# Patient Record
Sex: Male | Born: 1995 | Race: White | Hispanic: No | Marital: Single | Smoking: Never smoker
Health system: Southern US, Community
[De-identification: ages and names within clinical notes are randomized; demographics above are authoritative.]

## PROBLEM LIST (undated history)

## (undated) DIAGNOSIS — J45909 Unspecified asthma, uncomplicated: Secondary | ICD-10-CM

## (undated) DIAGNOSIS — G43909 Migraine, unspecified, not intractable, without status migrainosus: Secondary | ICD-10-CM

---

## 2019-01-17 ENCOUNTER — Other Ambulatory Visit (HOSPITAL_COMMUNITY): Payer: Self-pay | Admitting: Gastroenterology

## 2019-01-17 ENCOUNTER — Other Ambulatory Visit: Payer: Self-pay | Admitting: Gastroenterology

## 2019-01-17 DIAGNOSIS — R1011 Right upper quadrant pain: Secondary | ICD-10-CM

## 2019-02-04 ENCOUNTER — Encounter (HOSPITAL_COMMUNITY): Admission: RE | Admit: 2019-02-04 | Payer: Commercial Managed Care - PPO | Source: Ambulatory Visit

## 2019-02-04 ENCOUNTER — Ambulatory Visit (HOSPITAL_COMMUNITY): Payer: Commercial Managed Care - PPO | Attending: Gastroenterology

## 2019-02-04 ENCOUNTER — Encounter (HOSPITAL_COMMUNITY): Payer: Self-pay

## 2019-02-17 ENCOUNTER — Other Ambulatory Visit: Payer: Self-pay | Admitting: Gastroenterology

## 2019-02-17 DIAGNOSIS — R634 Abnormal weight loss: Secondary | ICD-10-CM

## 2019-02-17 DIAGNOSIS — R1013 Epigastric pain: Secondary | ICD-10-CM

## 2019-03-07 ENCOUNTER — Other Ambulatory Visit: Payer: Self-pay | Admitting: Gastroenterology

## 2019-03-07 DIAGNOSIS — R110 Nausea: Secondary | ICD-10-CM

## 2019-03-07 DIAGNOSIS — R634 Abnormal weight loss: Secondary | ICD-10-CM

## 2019-03-07 DIAGNOSIS — R1013 Epigastric pain: Secondary | ICD-10-CM

## 2019-03-16 ENCOUNTER — Ambulatory Visit
Admission: RE | Admit: 2019-03-16 | Discharge: 2019-03-16 | Disposition: A | Discharge status: Home / Self Care | Payer: Commercial Managed Care - PPO | Source: Ambulatory Visit | Attending: Gastroenterology | Admitting: Gastroenterology

## 2019-03-16 ENCOUNTER — Other Ambulatory Visit: Payer: Self-pay

## 2019-03-16 DIAGNOSIS — R1013 Epigastric pain: Secondary | ICD-10-CM

## 2019-03-16 DIAGNOSIS — R110 Nausea: Secondary | ICD-10-CM

## 2019-03-16 DIAGNOSIS — R634 Abnormal weight loss: Secondary | ICD-10-CM

## 2019-03-16 MED ORDER — IOPAMIDOL (ISOVUE-300) INJECTION 61%
100.0000 mL | Freq: Once | INTRAVENOUS | Status: AC | PRN
  Administered 2019-03-16: 100 mL via INTRAVENOUS

## 2019-04-06 ENCOUNTER — Ambulatory Visit: Payer: Commercial Managed Care - PPO | Admitting: Neurology

## 2019-04-06 ENCOUNTER — Telehealth: Payer: Self-pay | Admitting: *Deleted

## 2019-04-06 NOTE — Telephone Encounter (Signed)
No showed new patient appointment. 

## 2019-10-13 ENCOUNTER — Ambulatory Visit (HOSPITAL_COMMUNITY)
Admission: EM | Admit: 2019-10-13 | Discharge: 2019-10-13 | Disposition: A | Discharge status: Home / Self Care | Payer: Commercial Managed Care - PPO | Attending: Family Medicine | Admitting: Family Medicine

## 2019-10-13 ENCOUNTER — Other Ambulatory Visit: Payer: Self-pay

## 2019-10-13 ENCOUNTER — Encounter (HOSPITAL_COMMUNITY): Payer: Self-pay

## 2019-10-13 DIAGNOSIS — G43919 Migraine, unspecified, intractable, without status migrainosus: Secondary | ICD-10-CM

## 2019-10-13 MED ORDER — KETOROLAC TROMETHAMINE 30 MG/ML IJ SOLN
INTRAMUSCULAR | Status: AC
  Filled 2019-10-13: qty 1

## 2019-10-13 MED ORDER — DICLOFENAC SODIUM 75 MG PO TBEC: 75.0000 mg | DELAYED_RELEASE_TABLET | Freq: Two times a day (BID) | ORAL | 0 refills | Status: AC | PRN

## 2019-10-13 MED ORDER — KETOROLAC TROMETHAMINE 30 MG/ML IJ SOLN
30.0000 mg | Freq: Once | INTRAMUSCULAR | Status: AC
  Administered 2019-10-13: 30 mg via INTRAMUSCULAR

## 2019-10-13 NOTE — Discharge Instructions (Signed)
You were given a shot of Toradol 30mg  today to help with your headache. In 3 to 4 hours, if the pain persists, you may also take Voltaren 75mg  every 12 hours as needed. Rest. If pain does not improve within 48 hours, return for recheck or go to the ER if pain worsens or any increased dizziness, nausea with vomiting, or vision changes occur.

## 2019-10-13 NOTE — ED Triage Notes (Signed)
Pt state he has a migriane for 3 hours.

## 2019-10-14 NOTE — ED Provider Notes (Signed)
[facility]    CSN: [id number] Arrival date & time: 10/13/19  1935      History   Chief Complaint Chief Complaint  Patient presents with  . Migraine    HPI Warren Garrett is a 24 y.o. male.   24 year old male presents with a headache that started about 3 hours ago. Pain is mainly in the frontal region and top of his head. It is pounding. He also is experiencing some nausea and photophobia but denies any vomiting, fever, vision changes, numbness or weakness. He has had some nasal congestion with the headache but no sore throat, cough or chest pain. He has been diagnosed with migraine headaches in the past and this is similar to previous headaches except it has been more intense. He also has a history of previous concussion and had imaging 2 years ago which was "normal" per patient. He has not seen a Neurologist yet. He has taken Ibuprofen 800mg  with some relief. No other chronic health issues except mild GERD and takes a daily medication (uncertain of name) for management.   The history is provided by the patient.    History reviewed. No pertinent past medical history.  There are no problems to display for this patient.   History reviewed. No pertinent surgical history.     Home Medications    Prior to Admission medications   Medication Sig Start Date End Date Taking? Authorizing Provider  diclofenac (VOLTAREN) 75 MG EC tablet Take 1 tablet (75 mg total) by mouth every 12 (twelve) hours as needed for moderate pain. 10/13/19   12/13/19, NP    Family History History reviewed. No pertinent family history.  Social History Social History   Tobacco Use  . Smoking status: Never Smoker  . Smokeless tobacco: Never Used  Substance Use Topics  . Alcohol use: Yes  . Drug use: Yes    Types: Marijuana     Allergies   Patient has no known allergies.   Review of Systems Review of Systems  Constitutional: Negative for activity change, appetite change,  chills, diaphoresis and fever.  HENT: Positive for congestion. Negative for facial swelling, nosebleeds, rhinorrhea, sinus pressure, sinus pain, sore throat, tinnitus and trouble swallowing.   Eyes: Positive for photophobia. Negative for pain, discharge and redness.  Respiratory: Negative for cough, chest tightness and shortness of breath.   Cardiovascular: Negative for chest pain.  Gastrointestinal: Positive for nausea. Negative for vomiting.  Musculoskeletal: Negative for neck pain and neck stiffness.  Skin: Negative for rash and wound.  Allergic/Immunologic: Negative for environmental allergies, food allergies and immunocompromised state.  Neurological: Positive for light-headedness and headaches. Negative for dizziness, tremors, seizures, syncope, facial asymmetry, speech difficulty, weakness and numbness.  Hematological: Negative for adenopathy. Does not bruise/bleed easily.     Physical Exam Triage Vital Signs ED Triage Vitals  Enc Vitals Group     BP 10/13/19 1949 116/68     Pulse Rate 10/13/19 1949 67     Resp 10/13/19 1949 16     Temp 10/13/19 1949 98.3 F (36.8 C)     Temp Source 10/13/19 1949 Oral     SpO2 10/13/19 1949 100 %     Weight 10/13/19 1947 160 lb (72.6 kg)     Height --      Head Circumference --      Peak Flow --      Pain Score 10/13/19 1947 4     Pain Loc --  Pain Edu? --      Excl. in GC? --    No data found.  Updated Vital Signs BP 116/68 (BP Location: Right Arm)   Pulse 67   Temp 98.3 F (36.8 C) (Oral)   Resp 16   Wt 160 lb (72.6 kg)   SpO2 100%   Visual Acuity Right Eye Distance:   Left Eye Distance:   Bilateral Distance:    Right Eye Near:   Left Eye Near:    Bilateral Near:     Physical Exam Vitals and nursing note reviewed.  Constitutional:      General: He is awake. He is not in acute distress.    Appearance: He is well-developed and well-groomed. He is not ill-appearing.     Comments: Patient sitting comfortably on exam  table in no acute distress.   HENT:     Head: Normocephalic and atraumatic.      Comments: Pain at Parietal region of head but no tenderness.     Right Ear: Hearing, tympanic membrane and external ear normal. There is impacted cerumen.     Left Ear: Hearing, tympanic membrane and external ear normal. There is impacted cerumen.     Ears:     Comments: Cerumen present in both ear canals but not completely occluding canal.     Nose: Nose normal. No rhinorrhea.     Right Sinus: No maxillary sinus tenderness or frontal sinus tenderness.     Left Sinus: No maxillary sinus tenderness or frontal sinus tenderness.     Mouth/Throat:     Lips: Pink.     Mouth: Mucous membranes are moist.     Pharynx: Oropharynx is clear. Uvula midline. No pharyngeal swelling, oropharyngeal exudate, posterior oropharyngeal erythema or uvula swelling.  Eyes:     Extraocular Movements: Extraocular movements intact.     Conjunctiva/sclera: Conjunctivae normal.     Pupils: Pupils are equal, round, and reactive to light.  Neck:     Vascular: No carotid bruit.  Cardiovascular:     Rate and Rhythm: Normal rate and regular rhythm.     Pulses: Normal pulses.     Heart sounds: Normal heart sounds. No murmur.  Pulmonary:     Effort: Pulmonary effort is normal. No respiratory distress.     Breath sounds: Normal breath sounds and air entry. No decreased breath sounds, wheezing or rhonchi.  Musculoskeletal:        General: Normal range of motion.     Cervical back: Normal range of motion and neck supple. No rigidity or tenderness.  Lymphadenopathy:     Cervical: No cervical adenopathy.  Skin:    General: Skin is warm and dry.     Capillary Refill: Capillary refill takes less than 2 seconds.     Findings: No rash.  Neurological:     General: No focal deficit present.     Mental Status: He is alert and oriented to person, place, and time.     Cranial Nerves: Cranial nerves are intact.     Sensory: Sensation is intact.  No sensory deficit.     Motor: Motor function is intact.     Coordination: Coordination is intact.     Gait: Gait is intact.     Deep Tendon Reflexes: Reflexes are normal and symmetric.  Psychiatric:        Mood and Affect: Mood normal.        Behavior: Behavior normal. Behavior is cooperative.  Thought Content: Thought content normal.        Judgment: Judgment normal.      UC Treatments / Results  Labs (all labs ordered are listed, but only abnormal results are displayed) Labs Reviewed - No data to display  EKG   Radiology No results found.  Procedures Procedures (including critical care time)  Medications Ordered in UC Medications  ketorolac (TORADOL) 30 MG/ML injection 30 mg (30 mg Intramuscular Given 10/13/19 2016)    Initial Impression / Assessment and Plan / UC Course  I have reviewed the triage vital signs and the nursing notes.  Pertinent labs & imaging results that were available during my care of the patient were reviewed by me and considered in my medical decision making (see chart for details).    Reviewed with patient that he appears to be having another migraine headache- no concerning neurological findings. Do not believe any imaging is indicated at this time. Discussed that the intensity of the headache may be due to various factors including change in barometric pressure, stress, sleep pattern, food pattern or unknown. Continue to monitor symptoms and pain level. Gave Toradol 30mg  IM now to help with pain. Rest. May take Voltaren 75mg  every 12 hours as needed for headache- may take 4 hours after Toradol if headache has not resolved. Continue to stay well hydrated. If pain/headache does not improve within 48 hours, return here for recheck or go to the ER if pain worsens or any increased dizziness, weakness, nausea with vomiting or vision changes occurs.   Final Clinical Impressions(s) / UC Diagnoses   Final diagnoses:  Intractable migraine without status  migrainosus, unspecified migraine type     Discharge Instructions     You were given a shot of Toradol 30mg  today to help with your headache. In 3 to 4 hours, if the pain persists, you may also take Voltaren 75mg  every 12 hours as needed. Rest. If pain does not improve within 48 hours, return for recheck or go to the ER if pain worsens or any increased dizziness, nausea with vomiting, or vision changes occur.     ED Prescriptions    Medication Sig Dispense Auth. Provider   diclofenac (VOLTAREN) 75 MG EC tablet Take 1 tablet (75 mg total) by mouth every 12 (twelve) hours as needed for moderate pain. 30 tablet Amyot, Nicholes Stairs, NP     PDMP not reviewed this encounter.   Katy Apo, NP 10/14/19 [phone]

## 2019-12-15 ENCOUNTER — Other Ambulatory Visit: Payer: Self-pay | Admitting: Gastroenterology

## 2019-12-15 DIAGNOSIS — R1013 Epigastric pain: Secondary | ICD-10-CM

## 2019-12-23 ENCOUNTER — Ambulatory Visit
Admission: RE | Admit: 2019-12-23 | Discharge: 2019-12-23 | Disposition: A | Discharge status: Home / Self Care | Payer: Commercial Managed Care - PPO | Source: Ambulatory Visit | Attending: Gastroenterology | Admitting: Gastroenterology

## 2019-12-23 DIAGNOSIS — R1013 Epigastric pain: Secondary | ICD-10-CM

## 2020-04-04 ENCOUNTER — Ambulatory Visit (HOSPITAL_COMMUNITY): Payer: Self-pay

## 2020-06-12 IMAGING — CT CT ABDOMEN AND PELVIS WITH CONTRAST
1 of 2 series · 14 of 32 positions shown, 19 images · IV contrast (APPLIED)
Comparison: None

CLINICAL DATA: Epigastric pain, anorexia, and weight loss over past
9 months.

EXAM:
CT ABDOMEN AND PELVIS WITH CONTRAST
TECHNIQUE: Multidetector CT imaging of the abdomen and pelvis was performed
using the standard protocol following bolus administration of
intravenous contrast.
CONTRAST:  100mL [id number] IOPAMIDOL ([id number]) INJECTION 61%

[Series 2: abd/pelvis w/cm · axial · 0.67mm/px · z∈[-488,-93]mm · 14 of 89 slices shown, 19 images]
[im 5/89  soft-tissue]
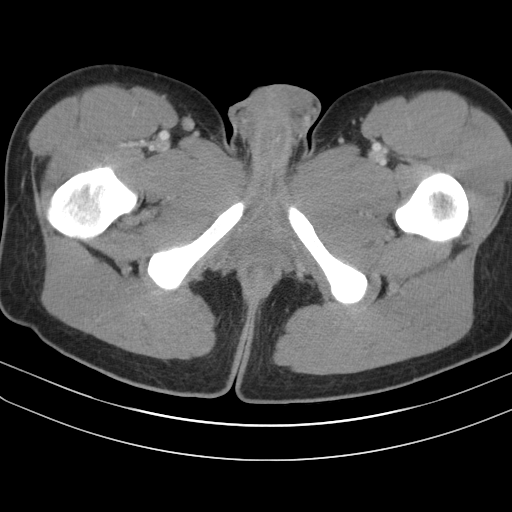
[im 5/89  bone]
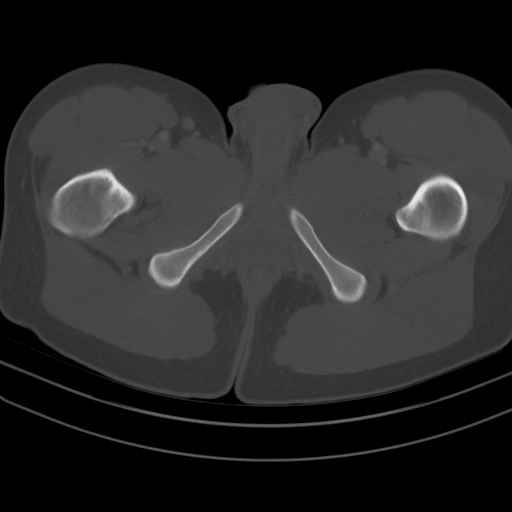
[im 10/89  soft-tissue]
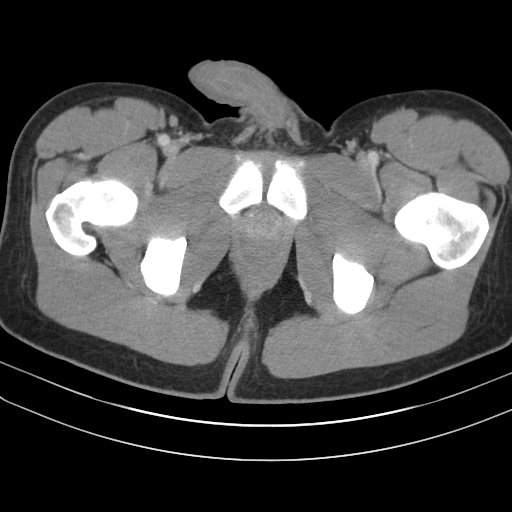
[im 20/89  soft-tissue]
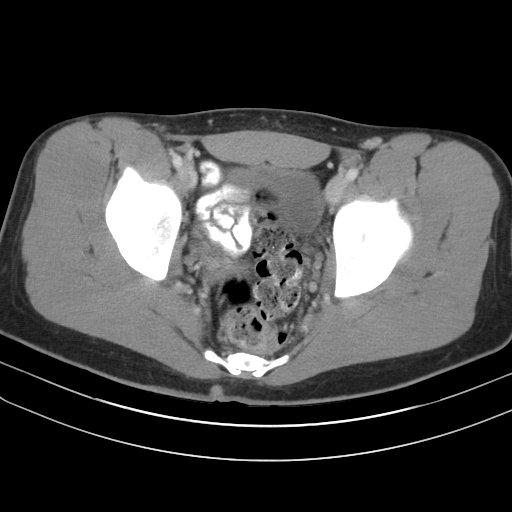
[im 25/89  soft-tissue]
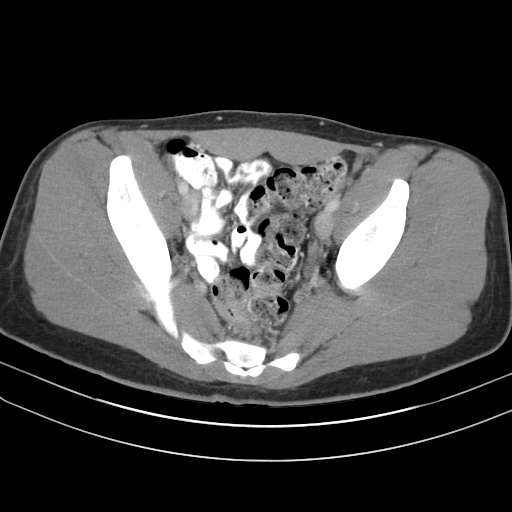
[im 30/89  soft-tissue]
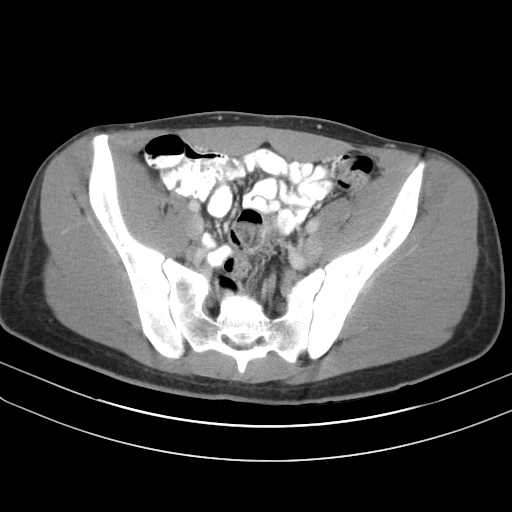
[im 40/89  soft-tissue]
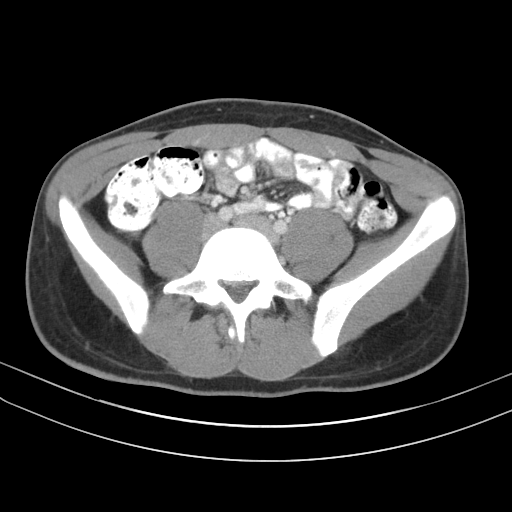
[im 45/89  soft-tissue]
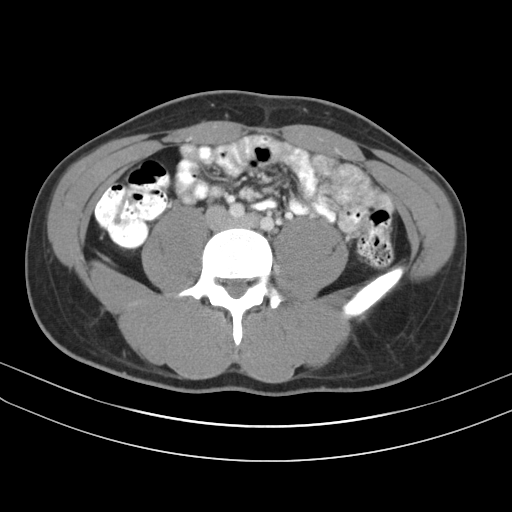
[im 49/89  soft-tissue]
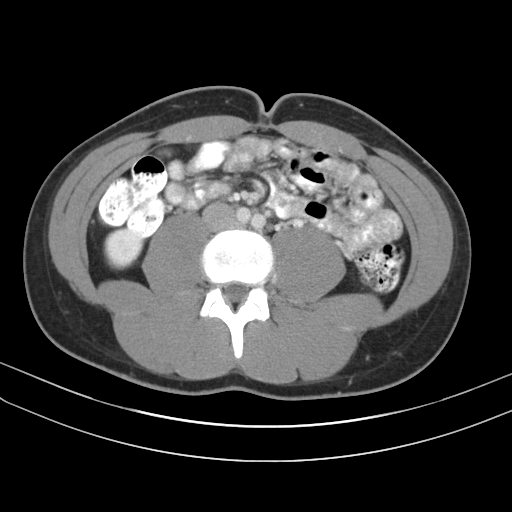
[im 59/89  soft-tissue]
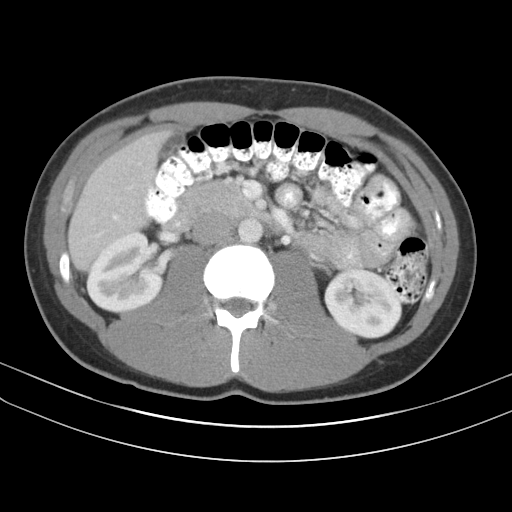
[im 59/89  bone]
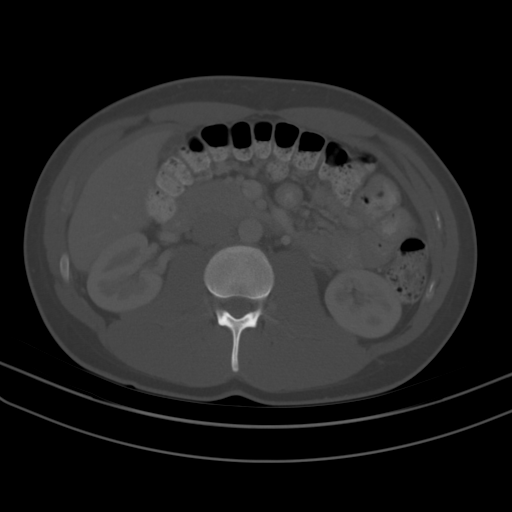
[im 64/89  soft-tissue]
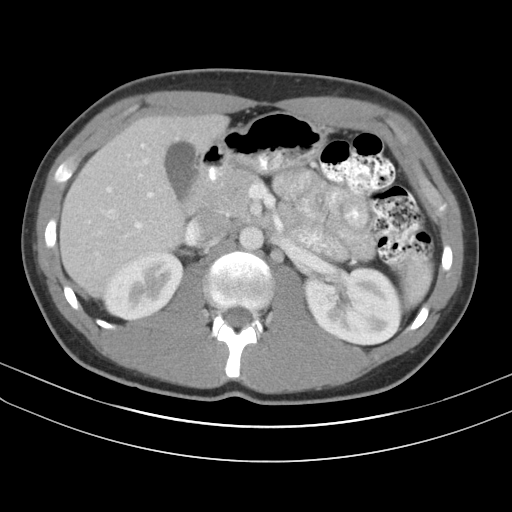
[im 69/89  soft-tissue]
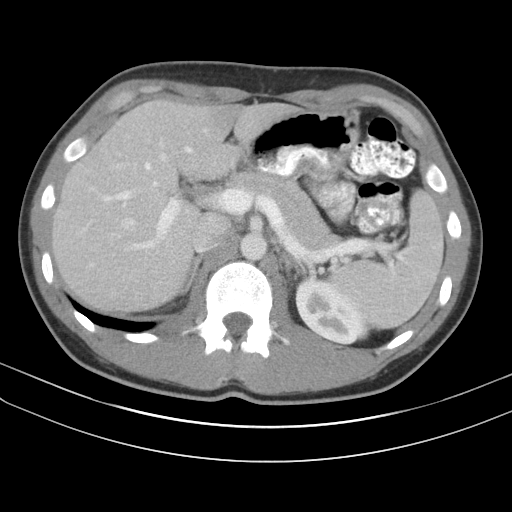
[im 69/89  lung]
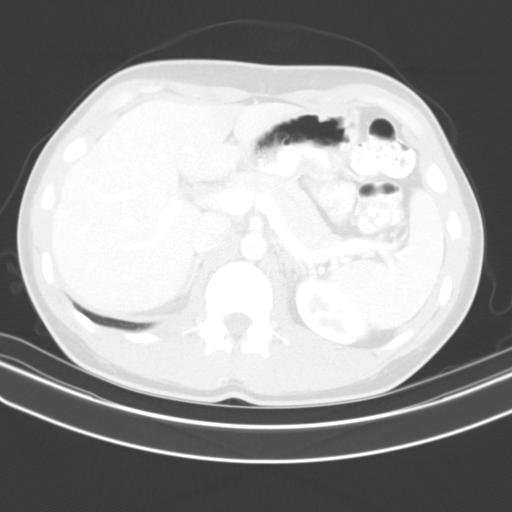
[im 74/89  lung]
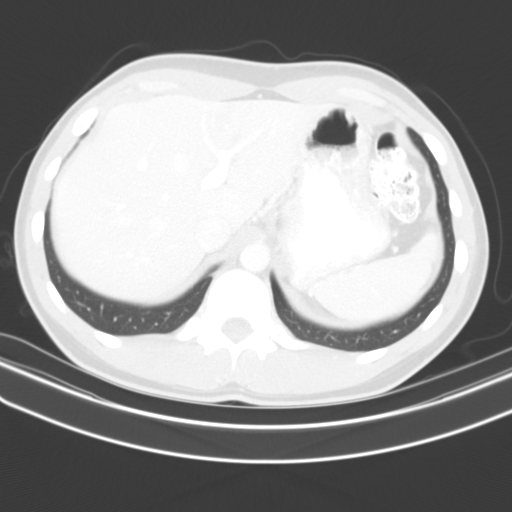
[im 79/89  soft-tissue]
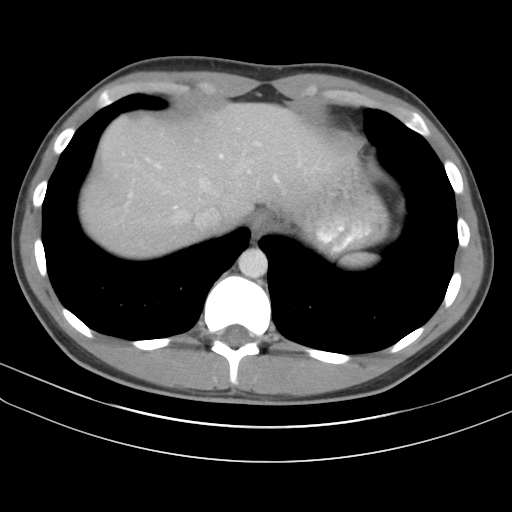
[im 79/89  lung]
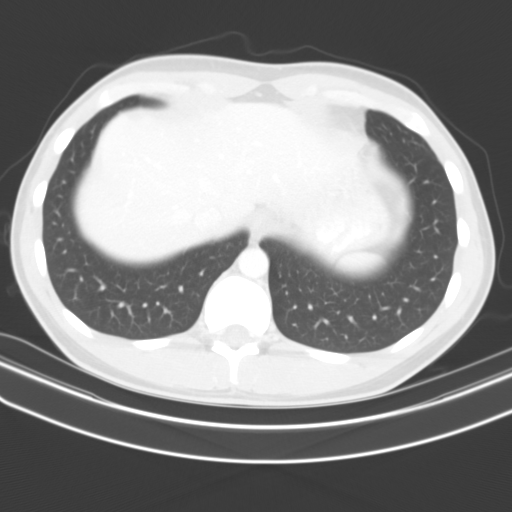
[im 84/89  soft-tissue]
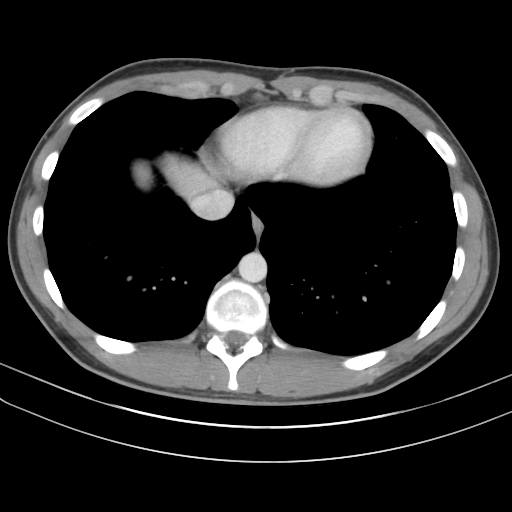
[im 84/89  lung]
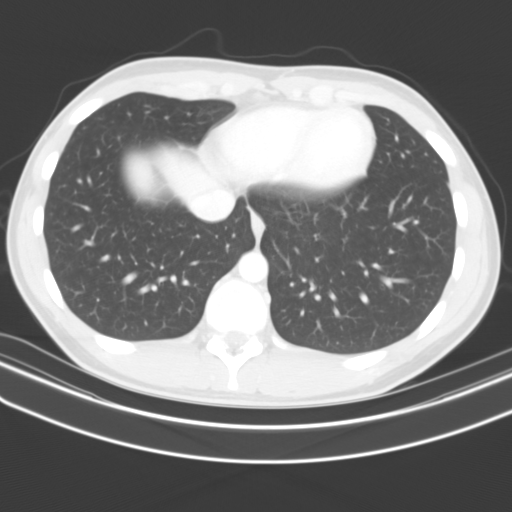

[14 of 32 positions shown; findings below may reference images not displayed]

FINDINGS: Lower Chest: No acute findings.

Hepatobiliary: No hepatic masses identified. Focal fatty
infiltration noted adjacent to the falciform ligament. Gallbladder
is unremarkable. No evidence of biliary ductal dilatation.

Pancreas:  No mass or inflammatory changes.

Spleen: Within normal limits in size and appearance.

Adrenals/Urinary Tract: No masses identified. No evidence of
hydronephrosis.

Stomach/Bowel: No evidence of hiatal hernia. No evidence of
obstruction, inflammatory process or abnormal fluid collections.
Normal appendix visualized.

Vascular/Lymphatic: No pathologically enlarged lymph nodes. No
abdominal aortic aneurysm.

Reproductive:  No mass or other significant abnormality.

Other:  None.

Musculoskeletal:  No suspicious bone lesions identified.
IMPRESSION: Negative.  No acute findings or other significant abnormality.

## 2021-05-31 DIAGNOSIS — G43009 Migraine without aura, not intractable, without status migrainosus: Secondary | ICD-10-CM | POA: Diagnosis not present

## 2021-06-13 ENCOUNTER — Emergency Department (HOSPITAL_BASED_OUTPATIENT_CLINIC_OR_DEPARTMENT_OTHER): Payer: BC Managed Care – PPO

## 2021-06-13 ENCOUNTER — Emergency Department (HOSPITAL_BASED_OUTPATIENT_CLINIC_OR_DEPARTMENT_OTHER)
Admission: EM | Admit: 2021-06-13 | Discharge: 2021-06-13 | Disposition: A | Discharge status: Home / Self Care | Payer: BC Managed Care – PPO | Attending: Emergency Medicine | Admitting: Emergency Medicine

## 2021-06-13 ENCOUNTER — Encounter (HOSPITAL_BASED_OUTPATIENT_CLINIC_OR_DEPARTMENT_OTHER): Payer: Self-pay

## 2021-06-13 ENCOUNTER — Other Ambulatory Visit: Payer: Self-pay

## 2021-06-13 DIAGNOSIS — R0781 Pleurodynia: Secondary | ICD-10-CM | POA: Insufficient documentation

## 2021-06-13 DIAGNOSIS — R197 Diarrhea, unspecified: Secondary | ICD-10-CM | POA: Insufficient documentation

## 2021-06-13 DIAGNOSIS — J45909 Unspecified asthma, uncomplicated: Secondary | ICD-10-CM | POA: Diagnosis not present

## 2021-06-13 DIAGNOSIS — R1013 Epigastric pain: Secondary | ICD-10-CM | POA: Diagnosis not present

## 2021-06-13 DIAGNOSIS — X501XXA Overexertion from prolonged static or awkward postures, initial encounter: Secondary | ICD-10-CM | POA: Diagnosis not present

## 2021-06-13 DIAGNOSIS — R9431 Abnormal electrocardiogram [ECG] [EKG]: Secondary | ICD-10-CM | POA: Diagnosis not present

## 2021-06-13 HISTORY — DX: Unspecified asthma, uncomplicated: J45.909

## 2021-06-13 HISTORY — DX: Migraine, unspecified, not intractable, without status migrainosus: G43.909

## 2021-06-13 LAB — CBC
HCT: 43.6 % (ref 39.0–52.0)
Hemoglobin: 15.4 g/dL (ref 13.0–17.0)
MCH: 32.1 pg (ref 26.0–34.0)
MCHC: 35.3 g/dL (ref 30.0–36.0)
MCV: 90.8 fL (ref 80.0–100.0)
Platelets: 229 10*3/uL (ref 150–400)
RBC: 4.8 MIL/uL (ref 4.22–5.81)
RDW: 11.7 % (ref 11.5–15.5)
WBC: 6 10*3/uL (ref 4.0–10.5)
nRBC: 0 % (ref 0.0–0.2)

## 2021-06-13 LAB — BASIC METABOLIC PANEL
Anion gap: 9 (ref 5–15)
BUN: 9 mg/dL (ref 6–20)
CO2: 25 mmol/L (ref 22–32)
Calcium: 9.8 mg/dL (ref 8.9–10.3)
Chloride: 104 mmol/L (ref 98–111)
Creatinine, Ser: 1.02 mg/dL (ref 0.61–1.24)
GFR, Estimated: >60 mL/min (ref >60)
Glucose, Bld: 93 mg/dL (ref 70–99)
Potassium: 3.9 mmol/L (ref 3.5–5.1)
Sodium: 138 mmol/L (ref 135–145)

## 2021-06-13 LAB — HEPATIC FUNCTION PANEL
ALT: 19 U/L (ref 0–44)
AST: 30 U/L (ref 15–41)
Albumin: 5 g/dL (ref 3.5–5.0)
Alkaline Phosphatase: 47 U/L (ref 38–126)
Bilirubin, Direct: 0.1 mg/dL (ref 0.0–0.2)
Indirect Bilirubin: 0.9 mg/dL (ref 0.3–0.9)
Total Bilirubin: 1 mg/dL (ref 0.3–1.2)
Total Protein: 8 g/dL (ref 6.5–8.1)

## 2021-06-13 LAB — TROPONIN I (HIGH SENSITIVITY): Troponin I (High Sensitivity): <2 ng/L (ref <18)

## 2021-06-13 LAB — LIPASE, BLOOD: Lipase: 30 U/L (ref 11–51)

## 2021-06-13 MED ORDER — FAMOTIDINE 20 MG PO TABS: 20.0000 mg | ORAL_TABLET | Freq: Two times a day (BID) | ORAL | 0 refills | Status: AC

## 2021-06-13 NOTE — Discharge Instructions (Addendum)
1.  Take Pepcid twice daily for the next 2 weeks.  Follow dietary instructions for reflux disease. 2.  Follow-up with your doctor as soon as possible for recheck. 3.  Return to the emergency department if you develop fever, vomiting, new or changing symptoms.

## 2021-06-13 NOTE — ED Notes (Addendum)
Pt began swearing and becoming verbally aggressive and posturing with staff when told we are waiting on lab results. Pt had recently been rude to another patient in the hallway. Gave pt discharge papers, explained he his prescription  has been sent to the pharmacy, and he can check MyChart for results. Did not get final discharge vitals or signature due to risk for violence.Pt ambulatory and left with friend.

## 2021-06-13 NOTE — ED Triage Notes (Addendum)
Pt c/o feeling a pop to left rib area yesterday, feeling of indigestion, "uncomfortable to swallow"-pain when he takes a deep breath-denies fever/flu sx-NAD-steady gait-pt with constant fast paced speech

## 2021-06-13 NOTE — ED Provider Notes (Signed)
[facility] EMERGENCY DEPARTMENT Provider Note   CSN: [id number] Arrival date & time: 06/13/21  1138     History Chief Complaint  Patient presents with   Multiple c/o    Warren Garrett is a 25 y.o. male.  HPI Patient reports he recently started on amitriptyline about a week ago.  He reports yesterday he had an episode where he moved and felt a pop in the left lower rib area.  He reports initially it was very uncomfortable.  It then calm down and had more of a diffuse feeling of fullness or gas in the epigastrium and under both ribs.  He reports he tried drinking a carbonated beverage and belching which improved somewhat.  He reports that since that time it did ease off quite a bit.  However he continues to have discomfort when he eats and drinks.  He reports it was worse earlier today from morning until about noon.  Then its been slowly easing off again.  Patient reports he has had history of a lot of GI issues when he was younger and seeing gastroenterology.  He describes having had multiple studies without any specific gastroenterological diagnosis.  He does report he was on PPI historically but never seem to get much relief of symptoms so has not been taking 1 for a long time.  Patient denies he has any vomiting.  He denies problems with constipation or diarrhea although he reports eating a sandwich bun last night to take his medications and then this morning having some diarrhea.  Patient wonders if symptoms might be due to amitriptyline.    Past Medical History:  Diagnosis Date   Asthma    Migraine     There are no problems to display for this patient.   History reviewed. No pertinent surgical history.     No family history on file.  Social History   Tobacco Use   Smoking status: Never   Smokeless tobacco: Never  Vaping Use   Vaping Use: Some days  Substance Use Topics   Alcohol use: Yes    Comment: occ   Drug use: Yes    Types: Marijuana    Home  Medications Prior to Admission medications   Medication Sig Start Date End Date Taking? Authorizing Provider  amitriptyline (ELAVIL) 75 MG tablet Take 75 mg by mouth at bedtime.   Yes [provider]  famotidine (PEPCID) 20 MG tablet Take 1 tablet (20 mg total) by mouth 2 (two) times daily. 06/13/21  Yes Arby Barrette, MD  diclofenac (VOLTAREN) 75 MG EC tablet Take 1 tablet (75 mg total) by mouth every 12 (twelve) hours as needed for moderate pain. 10/13/19   Sudie Grumbling, NP    Allergies    Patient has no known allergies.  Review of Systems   Review of Systems 10 systems reviewed and negative except as per HPI Physical Exam Updated Vital Signs BP 128/65 (BP Location: Left Arm)   Pulse 83   Temp 98.2 F (36.8 C) (Oral)   Resp 16   Ht 5\' 8"  (1.727 m)   Wt 68.5 kg   SpO2 100%   BMI 22.96 kg/m   Physical Exam Constitutional:      Comments: Alert nontoxic clinically well in appearance.  Well-nourished well-developed.  HENT:     Head: Normocephalic and atraumatic.     Mouth/Throat:     Pharynx: Oropharynx is clear.  Eyes:     Extraocular Movements: Extraocular movements intact.  Cardiovascular:  Rate and Rhythm: Normal rate and regular rhythm.  Pulmonary:     Effort: Pulmonary effort is normal.     Breath sounds: Normal breath sounds.  Abdominal:     General: There is no distension.     Palpations: Abdomen is soft.     Tenderness: There is no abdominal tenderness. There is no guarding.  Musculoskeletal:        General: No swelling or tenderness. Normal range of motion.     Right lower leg: No edema.     Left lower leg: No edema.  Skin:    General: Skin is warm and dry.  Neurological:     General: No focal deficit present.     Mental Status: He is oriented to person, place, and time.     Motor: No weakness.     Coordination: Coordination normal.    ED Results / Procedures / Treatments   Labs (all labs ordered are listed, but only abnormal results  are displayed) Labs Reviewed  BASIC METABOLIC PANEL  CBC  LIPASE, BLOOD  HEPATIC FUNCTION PANEL  TROPONIN I (HIGH SENSITIVITY)  TROPONIN I (HIGH SENSITIVITY)    EKG EKG Interpretation  Date/Time:  Thursday June 13 2021 12:20:04 EDT Ventricular Rate:  85 PR Interval:  142 QRS Duration: 88 QT Interval:  362 QTC Calculation: 430 R Axis:   120 Text Interpretation: Normal sinus rhythm with sinus arrhythmia Right axis deviation no acute ischemic appearance. no old comparrison Confirmed by Arby Barrette [id number]) on 06/13/2021 4:28:51 PM  Radiology DG Ribs Unilateral W/Chest Left  Result Date: 06/13/2021 CLINICAL DATA:  Feeling a pop in the lower left ribs EXAM: LEFT RIBS AND CHEST - 3+ VIEW COMPARISON:  01/09/2014 FINDINGS: No fracture or other bone lesions are seen involving the ribs. There is no evidence of pneumothorax or pleural effusion. Both lungs are clear. Heart size and mediastinal contours are within normal limits. IMPRESSION: No displaced fracture or other radiographic abnormalities of the lower left ribs to explain pain. No acute abnormality of the lungs. Electronically Signed   By: Jearld Lesch M.D.   On: 06/13/2021 13:10    Procedures Procedures   Medications Ordered in ED Medications - No data to display  ED Course  I have reviewed the triage vital signs and the nursing notes.  Pertinent labs & imaging results that were available during my care of the patient were reviewed by me and considered in my medical decision making (see chart for details).    MDM Rules/Calculators/A&P                           Patient presents with episode of epigastric pain starting yesterday.  Triggered by a movement and deep breath that created a popping sensation in the left lower chest wall.  Clinically no findings of mechanical abnormality.  Patient is clinically well in appearance.  Lungs are clear.  Chest x-ray normal.  Abdominal examination is nontender.  He is a well-nourished  well-developed otherwise healthy appearing 25 year old.  He describes symptoms of epigastric discomfort radiating out to both of the upper quadrants.  Nonspecific aching quality.  He also describes more discomfort with eating and drinking.  Patient is not having any vomiting.  He has no lower abdominal discomfort.  Symptoms have been improving since onset.  Patient expresses concern for amitriptyline as possible cause of symptoms.  At this time I have advised I do not suspect amitriptyline is being likely cause  of symptoms.  He does describe history of GI symptoms with prior consultations.  At this time, symptoms most suggestive of reflux or dyspepsia.  I have counseled on the use of Pepcid.  Patient is reluctant to restart a PPI.  I added on lipase and hepatic enzymes which were not ordered with initial triage initial lab order set.  Prior to getting results on these, patient became very agitated and distressed, citing observing management by staff of another patient in the hallway awaiting [facility] transportation.  Patient advised that he wanted his discharge paperwork immediately and was becoming very agitated being in the emergency department and must leave.  At this time, with patient clinically well, normal VS and a nontender abdomen, discharge papers have been provided with instructions for Pepcid use and recommendations for close follow-up. Final Clinical Impression(s) / ED Diagnoses Final diagnoses:  Epigastric pain    Rx / DC Orders ED Discharge Orders          Ordered    famotidine (PEPCID) 20 MG tablet  2 times daily        06/13/21 1729             Arby Barrette, MD 06/13/21 1749

## 2021-06-25 DIAGNOSIS — G43009 Migraine without aura, not intractable, without status migrainosus: Secondary | ICD-10-CM | POA: Diagnosis not present

## 2021-10-23 DIAGNOSIS — H6123 Impacted cerumen, bilateral: Secondary | ICD-10-CM | POA: Diagnosis not present

## 2021-10-23 DIAGNOSIS — J069 Acute upper respiratory infection, unspecified: Secondary | ICD-10-CM | POA: Diagnosis not present

## 2021-11-08 DIAGNOSIS — G43009 Migraine without aura, not intractable, without status migrainosus: Secondary | ICD-10-CM | POA: Diagnosis not present
